# Patient Record
Sex: Female | Born: 2000 | Race: Black or African American | Hispanic: No | Marital: Single | Smoking: Never smoker
Health system: Southern US, Community
[De-identification: ages and names within clinical notes are randomized; demographics above are authoritative.]

## PROBLEM LIST (undated history)

## (undated) DIAGNOSIS — D649 Anemia, unspecified: Secondary | ICD-10-CM

## (undated) HISTORY — PX: HERNIA REPAIR: SHX51

---
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
## 2019-07-08 ENCOUNTER — Encounter (HOSPITAL_COMMUNITY): Payer: Self-pay

## 2019-07-08 ENCOUNTER — Other Ambulatory Visit: Payer: Self-pay

## 2019-07-08 ENCOUNTER — Emergency Department (HOSPITAL_COMMUNITY)
Admission: EM | Admit: 2019-07-08 | Discharge: 2019-07-09 | Disposition: A | Discharge status: Home / Self Care | Payer: BC Managed Care – PPO | Attending: Emergency Medicine | Admitting: Emergency Medicine

## 2019-07-08 DIAGNOSIS — R509 Fever, unspecified: Secondary | ICD-10-CM | POA: Insufficient documentation

## 2019-07-08 DIAGNOSIS — N390 Urinary tract infection, site not specified: Secondary | ICD-10-CM | POA: Diagnosis not present

## 2019-07-08 DIAGNOSIS — Z20822 Contact with and (suspected) exposure to covid-19: Secondary | ICD-10-CM | POA: Insufficient documentation

## 2019-07-08 DIAGNOSIS — R110 Nausea: Secondary | ICD-10-CM | POA: Insufficient documentation

## 2019-07-08 DIAGNOSIS — R109 Unspecified abdominal pain: Secondary | ICD-10-CM | POA: Diagnosis present

## 2019-07-08 MED ORDER — ACETAMINOPHEN 500 MG PO TABS
1000.0000 mg | ORAL_TABLET | Freq: Once | ORAL | Status: AC
  Administered 2019-07-08: 1000 mg via ORAL
  Filled 2019-07-08: qty 2

## 2019-07-08 MED ORDER — LACTATED RINGERS IV BOLUS
1000.0000 mL | Freq: Once | INTRAVENOUS | Status: AC
  Administered 2019-07-09: 1000 mL via INTRAVENOUS

## 2019-07-08 MED ORDER — ONDANSETRON HCL 4 MG/2ML IJ SOLN
4.0000 mg | Freq: Once | INTRAMUSCULAR | Status: DC
  Filled 2019-07-08: qty 2

## 2019-07-08 MED ORDER — ACETAMINOPHEN 325 MG PO TABS: 650.0000 mg | ORAL_TABLET | Freq: Once | ORAL | Status: DC | PRN

## 2019-07-08 MED ORDER — KETOROLAC TROMETHAMINE 30 MG/ML IJ SOLN
30.0000 mg | Freq: Once | INTRAMUSCULAR | Status: AC
  Administered 2019-07-09: 30 mg via INTRAVENOUS
  Filled 2019-07-08: qty 1

## 2019-07-08 NOTE — ED Provider Notes (Addendum)
TIME SEEN: 11:41 PM  CHIEF COMPLAINT: Bilateral flank pain, fever, chills, nausea, urinary frequency and urgency  HPI: Patient is a 19 year old female with history of anemia who presents to the emergency department with complaints of fevers, chills that started today.  Reports since 06/14/2019 she has had urinary symptoms.  She was seen at an urgent care and started on Macrobid which she took for 5 days.  She reports at that time she had urinary frequency, urgency and cloudy urine.  She feels like her symptoms improved but never resolved.  Now she is feeling worse and having bilateral flank pain, nausea.  No vomiting.  No diarrhea.  Currently on her menstrual cycle.  No history of kidney stones or pyelonephritis.  She denies any cough, sore throat, runny nose, Covid exposures.  She has not been vaccinated for COVID-19.  ROS: See HPI Constitutional:  fever  Eyes: no drainage  ENT: no runny nose   Cardiovascular:  no chest pain  Resp: no SOB  GI: no vomiting GU: no dysuria; + urinary frequency and urgency Integumentary: no rash  Allergy: no hives  Musculoskeletal: no leg swelling  Neurological: no slurred speech ROS otherwise negative  PAST MEDICAL HISTORY/PAST SURGICAL HISTORY:  Past Medical History:  Diagnosis Date  . Anemia     MEDICATIONS:  Prior to Admission medications   Medication Sig Start Date End Date Taking? Authorizing Provider  Ascorbic Acid (VITAMIN C PO) Take 1 tablet by mouth daily.    [provider]  CRANBERRY EXTRACT PO Take 1 tablet by mouth daily.    [provider]  ferrous sulfate 325 (65 FE) MG tablet Take 1 tablet (325 mg total) by mouth daily. 01/16/17   Everlene Farrier, PA-C  meclizine (ANTIVERT) 12.5 MG tablet Take 1 tablet (12.5 mg total) by mouth 3 (three) times daily as needed for dizziness. 09/04/17   Margarita Grizzle, MD  tretinoin (RETIN-A) 0.01 % gel Apply 1 application topically daily. 06/28/17   [provider]    ALLERGIES:   No Known Allergies  SOCIAL HISTORY:  Social History   Tobacco Use  . Smoking status: Never Smoker  . Smokeless tobacco: Never Used  Substance Use Topics  . Alcohol use: Not Currently    FAMILY HISTORY: No family history on file.  EXAM: BP (S) (!) 84/59 (BP Location: Right Arm)   Pulse 89   Temp (!) 102 F (38.9 C) (Oral)   Resp 19   Ht 5\' 5"  (1.651 m)   Wt 75 kg   SpO2 100%   BMI 27.51 kg/m  CONSTITUTIONAL: Alert and oriented and responds appropriately to questions. Well-appearing; well-nourished, in no distress, nontoxic-appearing HEAD: Normocephalic EYES: Conjunctivae clear, pupils appear equal, EOM appear intact ENT: normal nose; moist mucous membranes NECK: Supple, normal ROM CARD: RRR; S1 and S2 appreciated; no murmurs, no clicks, no rubs, no gallops RESP: Normal chest excursion without splinting or tachypnea; breath sounds clear and equal bilaterally; no wheezes, no rhonchi, no rales, no hypoxia or respiratory distress, speaking full sentences ABD/GI: Normal bowel sounds; non-distended; soft, non-tender, no rebound, no guarding, no peritoneal signs, no hepatosplenomegaly BACK:  The back appears normal EXT: Normal ROM in all joints; no deformity noted, no edema; no cyanosis SKIN: Normal color for age and race; warm; no rash on exposed skin NEURO: Moves all extremities equally PSYCH: The patient's mood and manner are appropriate.   MEDICAL DECISION MAKING: Patient here with symptoms of UTI versus pyelonephritis.  Will obtain CT scan to  rule out kidney stone.  She is hypertensive here but is young and small so I suspect that this is actually not far off from her normal blood pressure.  She is febrile.  Will give Tylenol, IV fluids.  Will treat symptoms with Toradol, Zofran.  Abdominal exam today is benign.  She is nontoxic-appearing.  Will obtain labs, cultures, Covid swab for possible admission.  ED PROGRESS: Patient's work-up shows leukocytosis With left shift.   Lactate normal.  She does appear to have a urinary tract infection.  CT shows no pyelonephritis, kidney stone.  CT findings suggestive of UTI.  Will discharge with cefdinir x 10 days to cover clinically for early pyelonephritis.  Given dose of Rocephin here.  Blood pressures have improved and patient continues to be well-appearing and states she is feeling better.  She is comfortable with this plan.   At this time, I do not feel there is any life-threatening condition present. I have reviewed, interpreted and discussed all results (EKG, imaging, lab, urine as appropriate) and exam findings with patient/family. I have reviewed nursing notes and appropriate previous records.  I feel the patient is safe to be discharged home without further emergent workup and can continue workup as an outpatient as needed. Discussed usual and customary return precautions. Patient/family verbalize understanding and are comfortable with this plan.  Outpatient follow-up has been provided as needed. All questions have been answered.   CRITICAL CARE Performed by: Rochele Raring   Total critical care time: 45 minutes  Critical care time was exclusive of separately billable procedures and treating other patients.  Critical care was necessary to treat or prevent imminent or life-threatening deterioration.  Critical care was time spent personally by me on the following activities: development of treatment plan with patient and/or surrogate as well as nursing, discussions with consultants, evaluation of patient's response to treatment, examination of patient, obtaining history from patient or surrogate, ordering and performing treatments and interventions, ordering and review of laboratory studies, ordering and review of radiographic studies, pulse oximetry and re-evaluation of patient's condition.   Tammie Cole was evaluated in Emergency Department on 07/08/2019 for the symptoms described in the history of present illness.  She was evaluated in the context of the global COVID-19 pandemic, which necessitated consideration that the patient might be at risk for infection with the SARS-CoV-2 virus that causes COVID-19. Institutional protocols and algorithms that pertain to the evaluation of patients at risk for COVID-19 are in a state of rapid change based on information released by regulatory bodies including the CDC and federal and state organizations. These policies and algorithms were followed during the patient's care in the ED.      Gaynor Ferreras, Layla Maw, DO 07/09/19 0222    Breton Berns, Layla Maw, DO 07/09/19 [phone]

## 2019-07-08 NOTE — ED Triage Notes (Signed)
Pt sts a UTI for several weeks. Took entire Macrobid and having worsening bilateral flank pain, fever, chills.

## 2019-07-09 ENCOUNTER — Emergency Department (HOSPITAL_COMMUNITY): Payer: BC Managed Care – PPO

## 2019-07-09 ENCOUNTER — Encounter (HOSPITAL_COMMUNITY): Payer: Self-pay

## 2019-07-09 LAB — I-STAT BETA HCG BLOOD, ED (MC, WL, AP ONLY): I-stat hCG, quantitative: <5 m[IU]/mL (ref <5)

## 2019-07-09 LAB — URINALYSIS, ROUTINE W REFLEX MICROSCOPIC
Bilirubin Urine: NEGATIVE
Glucose, UA: NEGATIVE mg/dL
Ketones, ur: 20 mg/dL — AB
Nitrite: POSITIVE — AB
Protein, ur: 30 mg/dL — AB
Specific Gravity, Urine: 1.03 (ref 1.005–1.030)
WBC, UA: >50 WBC/hpf — ABNORMAL HIGH (ref 0–5)
pH: 6 (ref 5.0–8.0)

## 2019-07-09 LAB — COMPREHENSIVE METABOLIC PANEL
ALT: 13 U/L (ref 0–44)
AST: 18 U/L (ref 15–41)
Albumin: 4.2 g/dL (ref 3.5–5.0)
Alkaline Phosphatase: 55 U/L (ref 38–126)
Anion gap: 10 (ref 5–15)
BUN: 7 mg/dL (ref 6–20)
CO2: 23 mmol/L (ref 22–32)
Calcium: 9.2 mg/dL (ref 8.9–10.3)
Chloride: 101 mmol/L (ref 98–111)
Creatinine, Ser: 0.82 mg/dL (ref 0.44–1.00)
GFR calc Af Amer: >60 mL/min (ref >60)
GFR calc non Af Amer: >60 mL/min (ref >60)
Glucose, Bld: 109 mg/dL — ABNORMAL HIGH (ref 70–99)
Potassium: 3.2 mmol/L — ABNORMAL LOW (ref 3.5–5.1)
Sodium: 134 mmol/L — ABNORMAL LOW (ref 135–145)
Total Bilirubin: 0.8 mg/dL (ref 0.3–1.2)
Total Protein: 8 g/dL (ref 6.5–8.1)

## 2019-07-09 LAB — CBC WITH DIFFERENTIAL/PLATELET
Abs Immature Granulocytes: 0.14 10*3/uL — ABNORMAL HIGH (ref 0.00–0.07)
Basophils Absolute: 0 10*3/uL (ref 0.0–0.1)
Basophils Relative: 0 %
Eosinophils Absolute: 0 10*3/uL (ref 0.0–0.5)
Eosinophils Relative: 0 %
HCT: 31.2 % — ABNORMAL LOW (ref 36.0–46.0)
Hemoglobin: 9.2 g/dL — ABNORMAL LOW (ref 12.0–15.0)
Immature Granulocytes: 1 %
Lymphocytes Relative: 4 %
Lymphs Abs: 0.7 10*3/uL (ref 0.7–4.0)
MCH: 23.7 pg — ABNORMAL LOW (ref 26.0–34.0)
MCHC: 29.5 g/dL — ABNORMAL LOW (ref 30.0–36.0)
MCV: 80.4 fL (ref 80.0–100.0)
Monocytes Absolute: 1.6 10*3/uL — ABNORMAL HIGH (ref 0.1–1.0)
Monocytes Relative: 10 %
Neutro Abs: 13.3 10*3/uL — ABNORMAL HIGH (ref 1.7–7.7)
Neutrophils Relative %: 85 %
Platelets: 241 10*3/uL (ref 150–400)
RBC: 3.88 MIL/uL (ref 3.87–5.11)
RDW: 17.7 % — ABNORMAL HIGH (ref 11.5–15.5)
WBC: 15.8 10*3/uL — ABNORMAL HIGH (ref 4.0–10.5)
nRBC: 0 % (ref 0.0–0.2)

## 2019-07-09 LAB — LACTIC ACID, PLASMA: Lactic Acid, Venous: 1 mmol/L (ref 0.5–1.9)

## 2019-07-09 LAB — SARS CORONAVIRUS 2 BY RT PCR (HOSPITAL ORDER, PERFORMED IN ~~LOC~~ HOSPITAL LAB): SARS Coronavirus 2: NEGATIVE

## 2019-07-09 MED ORDER — IOHEXOL 300 MG/ML  SOLN
100.0000 mL | Freq: Once | INTRAMUSCULAR | Status: AC | PRN
  Administered 2019-07-09: 100 mL via INTRAVENOUS

## 2019-07-09 MED ORDER — SODIUM CHLORIDE (PF) 0.9 % IJ SOLN
INTRAMUSCULAR | Status: AC
  Filled 2019-07-09: qty 50

## 2019-07-09 MED ORDER — SODIUM CHLORIDE 0.9 % IV SOLN
1.0000 g | Freq: Once | INTRAVENOUS | Status: AC
  Administered 2019-07-09: 1 g via INTRAVENOUS
  Filled 2019-07-09: qty 10

## 2019-07-09 MED ORDER — CEFDINIR 300 MG PO CAPS
300.0000 mg | ORAL_CAPSULE | Freq: Two times a day (BID) | ORAL | 0 refills | Status: DC
Start: 2019-07-09 — End: 2020-05-17

## 2019-07-09 MED ORDER — ONDANSETRON 4 MG PO TBDP
4.0000 mg | ORAL_TABLET | Freq: Four times a day (QID) | ORAL | 0 refills | Status: DC | PRN
Start: 2019-07-09 — End: 2019-07-09

## 2019-07-09 MED ORDER — ONDANSETRON 4 MG PO TBDP
4.0000 mg | ORAL_TABLET | Freq: Four times a day (QID) | ORAL | 0 refills | Status: AC | PRN
Start: 2019-07-09 — End: ?

## 2019-07-09 MED ORDER — CEFDINIR 300 MG PO CAPS
300.0000 mg | ORAL_CAPSULE | Freq: Two times a day (BID) | ORAL | 0 refills | Status: DC
Start: 2019-07-09 — End: 2019-07-09

## 2019-07-09 NOTE — Discharge Instructions (Signed)
You may alternate Tylenol 1000 mg every 6 hours as needed for pain, fever and Ibuprofen 800 mg every 8 hours as needed for pain, fever.  Please take Ibuprofen with food.  Do not take more than 4000 mg of Tylenol (acetaminophen) in a 24 hour period.    Steps to find a Primary Care Provider (PCP):  Call [phone] or [phone] to access "[facility] Find a Doctor Service."  2.  You may also go on the [facility] website at [email or web address]  3.  [facility] also frequently accepts new patients.  [facility]  [address] [place] [address] [phone]  4.  There are also multiple [facility], [facility] and [facility] practices throughout the [place] that are frequently accepting new patients. You may find a clinic that is close to your home and contact them.  [email or web address] [phone]  [facility] [email or web address]  [email or web address] [phone]  [email or web address] [phone]  5.  Local Health Departments also can provide primary care services.  [facility]  [address] [place] [address] [phone]  [facility] [address] [place] [address] [phone]  [facility] [address]  [place] [address] [phone]   

## 2019-07-11 LAB — URINE CULTURE: Culture: >=100000 — AB

## 2019-07-12 ENCOUNTER — Telehealth: Payer: Self-pay

## 2019-07-12 NOTE — Telephone Encounter (Signed)
Post ED Visit - Positive Culture Follow-up  Culture report reviewed by antimicrobial stewardship pharmacist: [facility] []  , Pharm.D. []  Enzo Bi, Pharm.D., BCPS AQ-ID []  , Pharm.D., BCPS []  Celedonio Miyamoto, Pharm.D., BCPS []  [place], Garvin Fila.D., BCPS, AAHIVP []  , Pharm.D., BCPS, AAHIVP []  Georgina Pillion, PharmD, BCPS []  , PharmD, BCPS []  [place], PharmD, BCPS []  [place], PharmD []  , PharmD, BCPS []  Estella Husk, PharmD  Pharmacy Team []  Lysle Pearl, PharmD []  , PharmD []  Phillips Climes, PharmD []  , Rph []  Agapito Games) , PharmD []  Verlan Friends, PharmD []  , PharmD []  Mervyn Gay, PharmD []  , PharmD []  Vinnie Level, PharmD []  Wonda Olds, PharmD []  , PharmD [x]  Len Childs, PharmD   Positive urine culture Treated with Cefdinir, organism sensitive to the same and no further patient follow-up is required at this time.  07/12/2019, 11:49 AM

## 2019-07-14 LAB — CULTURE, BLOOD (ROUTINE X 2)
Culture: NO GROWTH
Culture: NO GROWTH

## 2019-07-18 ENCOUNTER — Other Ambulatory Visit: Payer: Self-pay

## 2019-07-18 ENCOUNTER — Emergency Department (HOSPITAL_COMMUNITY)
Admission: EM | Admit: 2019-07-18 | Discharge: 2019-07-19 | Discharge status: Against Medical Advice | Payer: BC Managed Care – PPO

## 2020-01-15 ENCOUNTER — Emergency Department (HOSPITAL_COMMUNITY)
Admission: EM | Admit: 2020-01-15 | Discharge: 2020-01-15 | Disposition: A | Discharge status: Home / Self Care | Payer: BC Managed Care – PPO | Attending: Emergency Medicine | Admitting: Emergency Medicine

## 2020-01-15 ENCOUNTER — Encounter (HOSPITAL_COMMUNITY): Payer: Self-pay

## 2020-01-15 ENCOUNTER — Other Ambulatory Visit: Payer: Self-pay

## 2020-01-15 DIAGNOSIS — Z5321 Procedure and treatment not carried out due to patient leaving prior to being seen by health care provider: Secondary | ICD-10-CM | POA: Insufficient documentation

## 2020-01-15 DIAGNOSIS — K469 Unspecified abdominal hernia without obstruction or gangrene: Secondary | ICD-10-CM | POA: Insufficient documentation

## 2020-01-15 NOTE — ED Notes (Signed)
This nurse spoke with patients mother on speaker phone per patient request. Mother states patient is not comfortable staying to be seen and requesting to leave. This nurse spoke with patient and mother about the current wait and the accomodation to lay flat while waiting for a room provided to the patient. Patient and mother verbalized understanding and asking about process to leave. This nurse explained that we would like for her to stay to be seen by a provider but patient declined and ambulated out of the department with no difficulty.

## 2020-01-15 NOTE — ED Triage Notes (Signed)
Patient arrived stating she had an abdominal wall hernia repaired in 2015, states it protruded again tonight.

## 2020-02-21 ENCOUNTER — Emergency Department (HOSPITAL_COMMUNITY)
Admission: EM | Admit: 2020-02-21 | Discharge: 2020-02-22 | Disposition: A | Discharge status: Home / Self Care | Payer: BC Managed Care – PPO | Attending: Emergency Medicine | Admitting: Emergency Medicine

## 2020-02-21 ENCOUNTER — Other Ambulatory Visit: Payer: Self-pay

## 2020-02-21 DIAGNOSIS — D649 Anemia, unspecified: Secondary | ICD-10-CM | POA: Insufficient documentation

## 2020-02-21 DIAGNOSIS — R109 Unspecified abdominal pain: Secondary | ICD-10-CM

## 2020-02-21 DIAGNOSIS — R1033 Periumbilical pain: Secondary | ICD-10-CM | POA: Diagnosis present

## 2020-02-21 LAB — CBC WITH DIFFERENTIAL/PLATELET
Abs Immature Granulocytes: 0.03 10*3/uL (ref 0.00–0.07)
Basophils Absolute: 0.1 10*3/uL (ref 0.0–0.1)
Basophils Relative: 1 %
Eosinophils Absolute: 0.1 10*3/uL (ref 0.0–0.5)
Eosinophils Relative: 1 %
HCT: 27.3 % — ABNORMAL LOW (ref 36.0–46.0)
Hemoglobin: 7.4 g/dL — ABNORMAL LOW (ref 12.0–15.0)
Immature Granulocytes: 0 %
Lymphocytes Relative: 22 %
Lymphs Abs: 2 10*3/uL (ref 0.7–4.0)
MCH: 19.2 pg — ABNORMAL LOW (ref 26.0–34.0)
MCHC: 27.1 g/dL — ABNORMAL LOW (ref 30.0–36.0)
MCV: 70.7 fL — ABNORMAL LOW (ref 80.0–100.0)
Monocytes Absolute: 0.9 10*3/uL (ref 0.1–1.0)
Monocytes Relative: 9 %
Neutro Abs: 6.2 10*3/uL (ref 1.7–7.7)
Neutrophils Relative %: 67 %
Platelets: 352 10*3/uL (ref 150–400)
RBC: 3.86 MIL/uL — ABNORMAL LOW (ref 3.87–5.11)
RDW: 20.1 % — ABNORMAL HIGH (ref 11.5–15.5)
WBC: 9.3 10*3/uL (ref 4.0–10.5)
nRBC: 0 % (ref 0.0–0.2)

## 2020-02-21 NOTE — ED Triage Notes (Signed)
Pt came in with abdominal pain from umbilical hernia. Pt had surgery in 2015, but states she is seeing another surgeon currently for another possible surgery. Pt states she has had uncontrolled pain radiating to her groin that started two days ago. Pt took extended release tylenol 20 min ago.

## 2020-02-21 NOTE — ED Provider Notes (Signed)
[facility]-EMERGENCY DEPT Provider Note   CSN: [id number] Arrival date & time: 02/21/20  2219     History Chief Complaint  Patient presents with  . Abdominal Pain    Tammie Cole is a 20 y.o. female.   Abdominal Pain Pain location:  Periumbilical Pain quality: aching   Pain radiates to:  Does not radiate Pain severity:  Moderate Onset quality:  Gradual Timing:  Intermittent Chronicity:  Recurrent Context comment:  History of abdominal wall hernia that she feels is protruding again Relieved by: Not bearing down. Exacerbated by: movement and bearing down. Ineffective treatments:  None tried Associated symptoms: no chest pain, no chills, no cough, no diarrhea, no dysuria, no fever, no nausea, no shortness of breath and no vomiting        Past Medical History:  Diagnosis Date  . Anemia     There are no problems to display for this patient.   Past Surgical History:  Procedure Laterality Date  . HERNIA REPAIR       OB History   No obstetric history on file.     No family history on file.  Social History   Tobacco Use  . Smoking status: Never Smoker  . Smokeless tobacco: Never Used  Substance Use Topics  . Alcohol use: Not Currently  . Drug use: Not Currently    Home Medications Prior to Admission medications   Medication Sig Start Date End Date Taking? Authorizing Provider  Acetaminophen (TYLENOL 8 HOUR PO) Take 1 tablet by mouth 2 (two) times daily as needed.   Yes [provider]  cefdinir (OMNICEF) 300 MG capsule Take 1 capsule (300 mg total) by mouth 2 (two) times daily. Patient not taking: Reported on 02/21/2020 07/09/19   Ward, Layla Maw, DO  ondansetron (ZOFRAN ODT) 4 MG disintegrating tablet Take 1 tablet (4 mg total) by mouth every 6 (six) hours as needed for nausea or vomiting. Patient not taking: Reported on 02/21/2020 07/09/19   Ward, Layla Maw, DO    Allergies    Patient has no known allergies.  Review of  Systems   Review of Systems  Constitutional: Negative for chills and fever.  HENT: Negative for congestion and rhinorrhea.   Respiratory: Negative for cough and shortness of breath.   Cardiovascular: Negative for chest pain and palpitations.  Gastrointestinal: Positive for abdominal pain. Negative for diarrhea, nausea and vomiting.  Genitourinary: Negative for difficulty urinating and dysuria.  Musculoskeletal: Negative for arthralgias and back pain.  Skin: Negative for rash and wound.  Neurological: Negative for light-headedness and headaches.    Physical Exam Updated Vital Signs BP 110/73 (BP Location: Right Arm)   Pulse 88   Temp 97.9 F (36.6 C) (Oral)   Resp 15   Ht 5\' 5"  (1.651 m)   Wt 67.1 kg   SpO2 100%   BMI 24.63 kg/m   Physical Exam Vitals and nursing note reviewed. Exam conducted with a chaperone present.  Constitutional:      General: She is not in acute distress.    Appearance: Normal appearance.  HENT:     Head: Normocephalic and atraumatic.     Nose: No rhinorrhea.  Eyes:     General:        Right eye: No discharge.        Left eye: No discharge.     Conjunctiva/sclera: Conjunctivae normal.  Cardiovascular:     Rate and Rhythm: Normal rate and regular rhythm.  Pulmonary:  Effort: Pulmonary effort is normal. No respiratory distress.     Breath sounds: No stridor.  Abdominal:     General: Abdomen is flat. There is no distension.     Palpations: Abdomen is soft.     Tenderness: There is abdominal tenderness in the periumbilical area. There is no guarding or rebound. Negative signs include Murphy's sign and Rovsing's sign.     Hernia: No hernia is present.  Musculoskeletal:        General: No tenderness or signs of injury.  Skin:    General: Skin is warm and dry.  Neurological:     General: No focal deficit present.     Mental Status: She is alert. Mental status is at baseline.     Motor: No weakness.  Psychiatric:        Mood and Affect: Mood  normal.        Behavior: Behavior normal.     ED Results / Procedures / Treatments   Labs (all labs ordered are listed, but only abnormal results are displayed) Labs Reviewed  CBC WITH DIFFERENTIAL/PLATELET - Abnormal; Notable for the following components:      Result Value   RBC 3.86 (*)    Hemoglobin 7.4 (*)    HCT 27.3 (*)    MCV 70.7 (*)    MCH 19.2 (*)    MCHC 27.1 (*)    RDW 20.1 (*)    All other components within normal limits  COMPREHENSIVE METABOLIC PANEL - Abnormal; Notable for the following components:   Potassium 3.4 (*)    Total Protein 8.3 (*)    All other components within normal limits  URINALYSIS, ROUTINE W REFLEX MICROSCOPIC  I-STAT BETA HCG BLOOD, ED ([facility], [facility], [facility] ONLY)    EKG None  Radiology No results found.  Procedures Procedures   Medications Ordered in ED Medications  ferumoxytol (FERAHEME) 510 mg in sodium chloride 0.9 % 100 mL IVPB ( Intravenous Stopped 02/22/20 0203)    ED Course  I have reviewed the triage vital signs and the nursing notes.  Pertinent labs & imaging results that were available during my care of the patient were reviewed by me and considered in my medical decision making (see chart for details).    MDM Rules/Calculators/A&P                          Patient comes in with months to years of worsening abdominal pain associated with an abdominal wall hernia.  She had a repair but feels that it is continue to protrude.  She is tolerating p.o. she is having bowel movements she is using Colace for stool softeners as things like bearing down make it worse.  On exam she has periumbilical tenderness but no peritoneal signs.  She is well-hydrated normal vital signs laboratory studies have been drawn by triage.  I feel no urgent need for CT imaging.  I feel that she just needs outpatient follow-up with general surgery.  She states the general surgeon is waiting to schedule appointment until she has release of records sent to his  office.  Review of this patient's labs show that she has chronic worsening anemia.  She states she is aware of this but no longer takes her iron because it constipates her which exacerbates her hernia situation.  We will give her IV iron and have her follow-up.  Other laboratory studies are unremarkable.  As for further need of imaging I feel  that she does not emergently need imaging but she does need follow-up with general surgery.  Patient tolerated IV iron infusion well.  Vital signs remained stable patient remains comfortable abdominal wall binder is placed to help patient's hernia.  She feels comfortable discharge home.  She has not explained this to her mother.  Her mother agrees with plan.  Outpatient surgery follow-up as needed.  Final Clinical Impression(s) / ED Diagnoses Final diagnoses:  Undifferentiated abdominal pain  Anemia, unspecified type    Rx / DC Orders ED Discharge Orders    None       Sabino Donovan, MD 02/22/20 [phone]

## 2020-02-22 LAB — I-STAT BETA HCG BLOOD, ED (MC, WL, AP ONLY): I-stat hCG, quantitative: <5 m[IU]/mL (ref <5)

## 2020-02-22 LAB — COMPREHENSIVE METABOLIC PANEL
ALT: 15 U/L (ref 0–44)
AST: 21 U/L (ref 15–41)
Albumin: 4.4 g/dL (ref 3.5–5.0)
Alkaline Phosphatase: 49 U/L (ref 38–126)
Anion gap: 11 (ref 5–15)
BUN: 16 mg/dL (ref 6–20)
CO2: 24 mmol/L (ref 22–32)
Calcium: 9.6 mg/dL (ref 8.9–10.3)
Chloride: 101 mmol/L (ref 98–111)
Creatinine, Ser: 0.72 mg/dL (ref 0.44–1.00)
GFR, Estimated: >60 mL/min (ref >60)
Glucose, Bld: 86 mg/dL (ref 70–99)
Potassium: 3.4 mmol/L — ABNORMAL LOW (ref 3.5–5.1)
Sodium: 136 mmol/L (ref 135–145)
Total Bilirubin: 0.7 mg/dL (ref 0.3–1.2)
Total Protein: 8.3 g/dL — ABNORMAL HIGH (ref 6.5–8.1)

## 2020-02-22 MED ORDER — SODIUM CHLORIDE 0.9 % IV SOLN
510.0000 mg | Freq: Once | INTRAVENOUS | Status: AC
  Administered 2020-02-22: 510 mg via INTRAVENOUS
  Filled 2020-02-22: qty 510

## 2020-02-22 NOTE — Discharge Instructions (Addendum)
You can take 600 mg of ibuprofen every 6 hours, you can take 1000 mg of Tylenol every 6 hours, you can alternate these every 3 or you can take them together.  Minder to help with the hernia.  Return to [place] if you have any concerns.  Specifically look out for the hernia coming out and not being able to go back yet.  Also if you start having bowel movements start getting significant abdominal bloating

## 2020-02-29 ENCOUNTER — Other Ambulatory Visit: Payer: Self-pay

## 2020-02-29 ENCOUNTER — Emergency Department
Admission: EM | Admit: 2020-02-29 | Discharge: 2020-03-01 | Disposition: A | Discharge status: Home / Self Care | Payer: BC Managed Care – PPO | Attending: Emergency Medicine | Admitting: Emergency Medicine

## 2020-02-29 DIAGNOSIS — N390 Urinary tract infection, site not specified: Secondary | ICD-10-CM | POA: Diagnosis not present

## 2020-02-29 DIAGNOSIS — R1033 Periumbilical pain: Secondary | ICD-10-CM

## 2020-02-29 LAB — COMPREHENSIVE METABOLIC PANEL
ALT: 19 U/L (ref 0–44)
AST: 29 U/L (ref 15–41)
Albumin: 4.1 g/dL (ref 3.5–5.0)
Alkaline Phosphatase: 48 U/L (ref 38–126)
Anion gap: 11 (ref 5–15)
BUN: 15 mg/dL (ref 6–20)
CO2: 18 mmol/L — ABNORMAL LOW (ref 22–32)
Calcium: 9.2 mg/dL (ref 8.9–10.3)
Chloride: 107 mmol/L (ref 98–111)
Creatinine, Ser: 0.7 mg/dL (ref 0.44–1.00)
GFR, Estimated: >60 mL/min (ref >60)
Glucose, Bld: 93 mg/dL (ref 70–99)
Potassium: 3.9 mmol/L (ref 3.5–5.1)
Sodium: 136 mmol/L (ref 135–145)
Total Bilirubin: 0.8 mg/dL (ref 0.3–1.2)
Total Protein: 7.9 g/dL (ref 6.5–8.1)

## 2020-02-29 LAB — LIPASE, BLOOD: Lipase: 27 U/L (ref 11–51)

## 2020-02-29 LAB — POC URINE PREG, ED: Preg Test, Ur: NEGATIVE

## 2020-02-29 MED ORDER — KETOROLAC TROMETHAMINE 30 MG/ML IJ SOLN
30.0000 mg | Freq: Once | INTRAMUSCULAR | Status: AC
  Administered 2020-03-01: 30 mg via INTRAVENOUS
  Filled 2020-02-29: qty 1

## 2020-02-29 NOTE — ED Triage Notes (Signed)
Pt states she was dx with abd hernia and was seen 1 week ago at [facility], pt was told symptoms to be concerned over for herniation and pt presents tonight with worsening abd pain, hot to the touch and has been unable to have a bowel movement for 3 days.

## 2020-02-29 NOTE — ED Notes (Signed)
Per MD ward no imaging at this time

## 2020-03-01 LAB — URINALYSIS, COMPLETE (UACMP) WITH MICROSCOPIC
Bilirubin Urine: NEGATIVE
Glucose, UA: NEGATIVE mg/dL
Hgb urine dipstick: NEGATIVE
Ketones, ur: NEGATIVE mg/dL
Nitrite: POSITIVE — AB
Protein, ur: NEGATIVE mg/dL
Specific Gravity, Urine: 1.021 (ref 1.005–1.030)
pH: 6 (ref 5.0–8.0)

## 2020-03-01 LAB — CBC
HCT: 31.3 % — ABNORMAL LOW (ref 36.0–46.0)
Hemoglobin: 8.6 g/dL — ABNORMAL LOW (ref 12.0–15.0)
MCH: 21.4 pg — ABNORMAL LOW (ref 26.0–34.0)
MCHC: 27.5 g/dL — ABNORMAL LOW (ref 30.0–36.0)
MCV: 77.9 fL — ABNORMAL LOW (ref 80.0–100.0)
Platelets: 270 10*3/uL (ref 150–400)
RBC: 4.02 MIL/uL (ref 3.87–5.11)
RDW: 28.7 % — ABNORMAL HIGH (ref 11.5–15.5)
WBC: 10.6 10*3/uL — ABNORMAL HIGH (ref 4.0–10.5)
nRBC: 0 % (ref 0.0–0.2)

## 2020-03-01 MED ORDER — CEPHALEXIN 500 MG PO CAPS: 500.0000 mg | ORAL_CAPSULE | Freq: Two times a day (BID) | ORAL | 0 refills | Status: DC

## 2020-03-01 MED ORDER — CEPHALEXIN 500 MG PO CAPS
500.0000 mg | ORAL_CAPSULE | Freq: Once | ORAL | Status: AC
  Administered 2020-03-01: 500 mg via ORAL
  Filled 2020-03-01: qty 1

## 2020-03-01 NOTE — ED Provider Notes (Signed)
[facility] Emergency Department Provider Note  ____________________________________________   Event Date/Time   First MD Initiated Contact with Patient 02/29/20 2331     (approximate)  I have reviewed the triage vital signs and the nursing notes.   HISTORY  Chief Complaint Abdominal Pain    HPI Tammie Cole is a 20 y.o. female with history of previous umbilical hernia status post surgical repair, anemia who presents to the emergency department with periumbilical pain.  States she is concerned that she could have an incarcerated hernia.  She states that she felt a burning pain over this area.  No overlying skin changes.  No fever, nausea or vomiting.  States he has not had a bowel movement in 3 days which is abnormal for her.  No dysuria, hematuria, vaginal bleeding or discharge.  She has an appointment with general surgery scheduled on March 17.        Past Medical History:  Diagnosis Date  . Anemia     There are no problems to display for this patient.   Past Surgical History:  Procedure Laterality Date  . HERNIA REPAIR      Prior to Admission medications   Medication Sig Start Date End Date Taking? Authorizing Provider  cephALEXin (KEFLEX) 500 MG capsule Take 1 capsule (500 mg total) by mouth 2 (two) times daily. 03/01/20  Yes Anapaola Kinsel, Layla Maw, DO  Acetaminophen (TYLENOL 8 HOUR PO) Take 1 tablet by mouth 2 (two) times daily as needed.    [provider]  cefdinir (OMNICEF) 300 MG capsule Take 1 capsule (300 mg total) by mouth 2 (two) times daily. Patient not taking: Reported on 02/21/2020 07/09/19   Johnthan Axtman, Layla Maw, DO  ondansetron (ZOFRAN ODT) 4 MG disintegrating tablet Take 1 tablet (4 mg total) by mouth every 6 (six) hours as needed for nausea or vomiting. Patient not taking: Reported on 02/21/2020 07/09/19   Keneth Borg, Layla Maw, DO    Allergies Patient has no known allergies.  No family history on file.  Social History Social  History   Tobacco Use  . Smoking status: Never Smoker  . Smokeless tobacco: Never Used  Substance Use Topics  . Alcohol use: Not Currently  . Drug use: Not Currently    Review of Systems Constitutional: No fever. Eyes: No visual changes. ENT: No sore throat. Cardiovascular: Denies chest pain. Respiratory: Denies shortness of breath. Gastrointestinal: No nausea, vomiting, diarrhea. Genitourinary: Negative for dysuria. Musculoskeletal: Negative for back pain. Skin: Negative for rash. Neurological: Negative for focal weakness or numbness.  ____________________________________________   PHYSICAL EXAM:  VITAL SIGNS: ED Triage Vitals  Enc Vitals Group     BP 02/29/20 2245 102/69     Pulse Rate 02/29/20 2245 81     Resp 02/29/20 2245 16     Temp 02/29/20 2245 98.4 F (36.9 C)     Temp src --      SpO2 02/29/20 2245 97 %     Weight 02/29/20 2243 150 lb (68 kg)     Height 02/29/20 2243 5\' 5"  (1.651 m)     Head Circumference --      Peak Flow --      Pain Score 02/29/20 2243 7     Pain Loc --      Pain Edu? --      Excl. in GC? --    CONSTITUTIONAL: Alert and oriented and responds appropriately to questions. Well-appearing; well-nourished HEAD: Normocephalic EYES: Conjunctivae clear, pupils appear equal, EOM  appear intact ENT: normal nose; moist mucous membranes NECK: Supple, normal ROM CARD: RRR; S1 and S2 appreciated; no murmurs, no clicks, no rubs, no gallops RESP: Normal chest excursion without splinting or tachypnea; breath sounds clear and equal bilaterally; no wheezes, no rhonchi, no rales, no hypoxia or respiratory distress, speaking full sentences ABD/GI: Normal bowel sounds; non-distended; soft, tender around the umbilicus without overlying skin changes.  I do not appreciate a palpable hernia.  When distracted her abdominal exam is benign. BACK: The back appears normal EXT: Normal ROM in all joints; no deformity noted, no edema; no cyanosis SKIN: Normal color  for age and race; warm; no rash on exposed skin NEURO: Moves all extremities equally PSYCH: The patient's mood and manner are appropriate.  ____________________________________________   LABS (all labs ordered are listed, but only abnormal results are displayed)  Labs Reviewed  COMPREHENSIVE METABOLIC PANEL - Abnormal; Notable for the following components:      Result Value   CO2 18 (*)    All other components within normal limits  URINALYSIS, COMPLETE (UACMP) WITH MICROSCOPIC - Abnormal; Notable for the following components:   Color, Urine YELLOW (*)    APPearance HAZY (*)    Nitrite POSITIVE (*)    Leukocytes,Ua MODERATE (*)    All other components within normal limits  CBC - Abnormal; Notable for the following components:   WBC 10.6 (*)    Hemoglobin 8.6 (*)    HCT 31.3 (*)    MCV 77.9 (*)    MCH 21.4 (*)    MCHC 27.5 (*)    RDW 28.7 (*)    All other components within normal limits  URINE CULTURE  LIPASE, BLOOD  POC URINE PREG, ED   ____________________________________________  EKG  none ____________________________________________  RADIOLOGY I, Bahja Bence, personally viewed and evaluated these images (plain radiographs) as part of my medical decision making, as well as reviewing the written report by the radiologist.  ED MD interpretation:  none  Official radiology report(s): No results found.  ____________________________________________   PROCEDURES  Procedure(s) performed (including Critical Care):  Procedures    ____________________________________________   INITIAL IMPRESSION / ASSESSMENT AND PLAN / ED COURSE  As part of my medical decision making, I reviewed the following data within the electronic MEDICAL RECORD NUMBER Nursing notes reviewed and incorporated, Labs reviewed and Notes from prior ED visits         Patient here with periumbilical abdominal pain.  She was concerned this could be an incarcerated hernia.  I do not appreciate an  incarcerated hernia on exam and she has no overlying skin changes.  No vomiting.  Her bowel sounds are normal.  Abdomen is soft.  When distracted, abdominal exam completely benign.  I do not think this is a bowel obstruction, incarcerated or strangulated hernia, colitis or diverticulitis, appendicitis.  Labs, urine pending from triage.  Will give Toradol and reassess.  ED PROGRESS  Patient's labs show hemoglobin 8.6 which appears chronic for her.  She states she has not taking her iron tablets at this time as they cause constipation.  Pregnancy test negative.  LFTs, lipase, creatinine normal.  She does appear to have a possible urinary tract infection with moderate leukocytes and positive for nitrites.  She denies urinary symptoms but this could be what is causing her burning lower abdominal pain.  No vaginal bleeding or discharge and she is not currently on her menstrual cycle.  Abdominal exam continues to be benign.  Reports feeling better  after Toradol.  Will discharge with prescription of Keflex.  Urine culture pending.  Patient comfortable with this plan.   At this time, I do not feel there is any life-threatening condition present. I have reviewed, interpreted and discussed all results (EKG, imaging, lab, urine as appropriate) and exam findings with patient/family. I have reviewed nursing notes and appropriate previous records.  I feel the patient is safe to be discharged home without further emergent workup and can continue workup as an outpatient as needed. Discussed usual and customary return precautions. Patient/family verbalize understanding and are comfortable with this plan.  Outpatient follow-up has been provided as needed. All questions have been answered.  ____________________________________________   FINAL CLINICAL IMPRESSION(S) / ED DIAGNOSES  Final diagnoses:  Periumbilical abdominal pain  Acute UTI     ED Discharge Orders         Ordered    cephALEXin (KEFLEX) 500 MG  capsule  2 times daily        03/01/20 0050          *Please note:  Linsie Lupo was evaluated in Emergency Department on 03/01/2020 for the symptoms described in the history of present illness. She was evaluated in the context of the global COVID-19 pandemic, which necessitated consideration that the patient might be at risk for infection with the SARS-CoV-2 virus that causes COVID-19. Institutional protocols and algorithms that pertain to the evaluation of patients at risk for COVID-19 are in a state of rapid change based on information released by regulatory bodies including the CDC and federal and state organizations. These policies and algorithms were followed during the patient's care in the ED.  Some ED evaluations and interventions may be delayed as a result of limited staffing during and the pandemic.*   Note:  This document was prepared using Dragon voice recognition software and may include unintentional dictation errors.   Jasmon Graffam, Layla Maw, DO 03/01/20 [phone]

## 2020-03-01 NOTE — Discharge Instructions (Addendum)
You may alternate Tylenol 1000 mg every 6 hours as needed for pain, fever and Ibuprofen 800 mg every 8 hours as needed for pain, fever.  Please take Ibuprofen with food.  Do not take more than 4000 mg of Tylenol (acetaminophen) in a 24 hour period.   Please follow-up with your surgeon as scheduled on the 17th.  Your labs are reassuring today but your urine showed signs of infection.  This could be the cause of your burning abdominal pain.  Please take your antibiotics until complete.

## 2020-03-03 LAB — URINE CULTURE: Culture: >=100000 — AB

## 2020-04-22 ENCOUNTER — Ambulatory Visit (HOSPITAL_COMMUNITY)
Admission: EM | Admit: 2020-04-22 | Discharge: 2020-04-22 | Disposition: A | Discharge status: Other Acute Inpatient Hospital | Payer: BC Managed Care – PPO | Attending: Urgent Care | Admitting: Urgent Care

## 2020-04-22 ENCOUNTER — Encounter (HOSPITAL_COMMUNITY): Payer: Self-pay

## 2020-04-22 ENCOUNTER — Other Ambulatory Visit: Payer: Self-pay

## 2020-04-22 ENCOUNTER — Emergency Department (HOSPITAL_COMMUNITY)
Admission: EM | Admit: 2020-04-22 | Discharge: 2020-04-22 | Disposition: A | Discharge status: Home / Self Care | Payer: BC Managed Care – PPO | Attending: Emergency Medicine | Admitting: Emergency Medicine

## 2020-04-22 DIAGNOSIS — J101 Influenza due to other identified influenza virus with other respiratory manifestations: Secondary | ICD-10-CM

## 2020-04-22 DIAGNOSIS — R000 Tachycardia, unspecified: Secondary | ICD-10-CM

## 2020-04-22 DIAGNOSIS — R059 Cough, unspecified: Secondary | ICD-10-CM | POA: Diagnosis present

## 2020-04-22 DIAGNOSIS — Z20822 Contact with and (suspected) exposure to covid-19: Secondary | ICD-10-CM | POA: Diagnosis not present

## 2020-04-22 DIAGNOSIS — J069 Acute upper respiratory infection, unspecified: Secondary | ICD-10-CM

## 2020-04-22 DIAGNOSIS — D649 Anemia, unspecified: Secondary | ICD-10-CM

## 2020-04-22 DIAGNOSIS — R9431 Abnormal electrocardiogram [ECG] [EKG]: Secondary | ICD-10-CM

## 2020-04-22 LAB — CBC WITH DIFFERENTIAL/PLATELET
Abs Immature Granulocytes: 0.03 10*3/uL (ref 0.00–0.07)
Basophils Absolute: 0 10*3/uL (ref 0.0–0.1)
Basophils Relative: 0 %
Eosinophils Absolute: 0 10*3/uL (ref 0.0–0.5)
Eosinophils Relative: 0 %
HCT: 35.9 % — ABNORMAL LOW (ref 36.0–46.0)
Hemoglobin: 10.4 g/dL — ABNORMAL LOW (ref 12.0–15.0)
Immature Granulocytes: 0 %
Lymphocytes Relative: 11 %
Lymphs Abs: 1.1 10*3/uL (ref 0.7–4.0)
MCH: 24.7 pg — ABNORMAL LOW (ref 26.0–34.0)
MCHC: 29 g/dL — ABNORMAL LOW (ref 30.0–36.0)
MCV: 85.3 fL (ref 80.0–100.0)
Monocytes Absolute: 0.9 10*3/uL (ref 0.1–1.0)
Monocytes Relative: 9 %
Neutro Abs: 7.7 10*3/uL (ref 1.7–7.7)
Neutrophils Relative %: 80 %
Platelets: 202 10*3/uL (ref 150–400)
RBC: 4.21 MIL/uL (ref 3.87–5.11)
RDW: 20.4 % — ABNORMAL HIGH (ref 11.5–15.5)
WBC: 9.8 10*3/uL (ref 4.0–10.5)
nRBC: 0 % (ref 0.0–0.2)

## 2020-04-22 LAB — URINALYSIS, ROUTINE W REFLEX MICROSCOPIC
Bilirubin Urine: NEGATIVE
Glucose, UA: NEGATIVE mg/dL
Ketones, ur: 20 mg/dL — AB
Nitrite: NEGATIVE
Protein, ur: 30 mg/dL — AB
Specific Gravity, Urine: 1.023 (ref 1.005–1.030)
pH: 5 (ref 5.0–8.0)

## 2020-04-22 LAB — BASIC METABOLIC PANEL
Anion gap: 11 (ref 5–15)
BUN: 9 mg/dL (ref 6–20)
CO2: 21 mmol/L — ABNORMAL LOW (ref 22–32)
Calcium: 9.1 mg/dL (ref 8.9–10.3)
Chloride: 103 mmol/L (ref 98–111)
Creatinine, Ser: 0.69 mg/dL (ref 0.44–1.00)
GFR, Estimated: >60 mL/min (ref >60)
Glucose, Bld: 89 mg/dL (ref 70–99)
Potassium: 3.5 mmol/L (ref 3.5–5.1)
Sodium: 135 mmol/L (ref 135–145)

## 2020-04-22 LAB — RESP PANEL BY RT-PCR (FLU A&B, COVID) ARPGX2
Influenza A by PCR: POSITIVE — AB
Influenza B by PCR: NEGATIVE
SARS Coronavirus 2 by RT PCR: NEGATIVE

## 2020-04-22 LAB — PREGNANCY, URINE: Preg Test, Ur: NEGATIVE

## 2020-04-22 MED ORDER — SODIUM CHLORIDE 0.9 % IV BOLUS
1000.0000 mL | Freq: Once | INTRAVENOUS | Status: AC
  Administered 2020-04-22: 1000 mL via INTRAVENOUS

## 2020-04-22 MED ORDER — ONDANSETRON HCL 4 MG PO TABS: 4.0000 mg | ORAL_TABLET | Freq: Three times a day (TID) | ORAL | 0 refills | Status: AC | PRN

## 2020-04-22 NOTE — ED Notes (Signed)
Patient is being discharged from the Urgent Care and sent to the Emergency Department via EMT . Per [facility], patient is in need of higher level of care due to elevated HR. Patient is aware and verbalizes understanding of plan of care.  Vitals:   04/22/20 1934 04/22/20 1951  BP: 98/63   Pulse: 80 (!) 160  Resp: 20   Temp: 98.7 F (37.1 C)   SpO2: 100%

## 2020-04-22 NOTE — Discharge Instructions (Signed)
You were seen in the emergency department for flulike symptoms and dehydration.  Your lab work showed you to be anemic but better than your baseline with a hemoglobin of 10.5.  You tested positive for influenza A.  Your COVID test was negative.  You will need to isolate till your symptoms are improved.

## 2020-04-22 NOTE — ED Notes (Signed)
Medications follow up appts reviewed w/ pt. Denies questions or concerns @ this time. Education on s/s of worsening and when to return. Evaluated for tachycardia., Found to have the flu. Heart rate was 98 since arrival and 85 after 1L fluids. States relief of sypmtoms @ this time. \Left w/ even and steady gait. NAD noted. PIV removed and VSS. Pt accompanied by friend.

## 2020-04-22 NOTE — ED Notes (Signed)
Reported HR to Mani-PA.

## 2020-04-22 NOTE — ED Triage Notes (Signed)
Pt c/o fever, sore throat, chills, coughing, watery eyes X 4 days.  Pt states from coughing a lot she states she has been dizzy.

## 2020-04-22 NOTE — ED Provider Notes (Signed)
[facility] EMERGENCY DEPARTMENT Provider Note   CSN: [id number] Arrival date & time: 04/22/20  2033     History Chief Complaint  Patient presents with  . Tachycardia    Tammie Cole is a 20 y.o. female.  She is complaining of flulike symptoms that have been going on for 4 days.  Fevers chills malaise nasal congestion sore throat.  Sick contact with a friend who had flu.  She went to urgent care today and they found her to be tachycardic.  She has a history of anemia.  She also gets frequent UTIs.  No nausea or vomiting.  No urinary symptoms.  The history is provided by the patient.  Influenza Presenting symptoms: cough, fatigue, fever, rhinorrhea and sore throat   Presenting symptoms: no diarrhea, no nausea, no shortness of breath and no vomiting   Fatigue:    Severity:  Moderate   Timing:  Constant   Progression:  Improving Relieved by:  Nothing Worsened by:  Nothing Ineffective treatments:  Hot fluids Associated symptoms: nasal congestion   Associated symptoms: no neck stiffness and no syncope   Risk factors: sick contacts        Past Medical History:  Diagnosis Date  . Anemia     There are no problems to display for this patient.   Past Surgical History:  Procedure Laterality Date  . HERNIA REPAIR       OB History   No obstetric history on file.     No family history on file.  Social History   Tobacco Use  . Smoking status: Never Smoker  . Smokeless tobacco: Never Used  Substance Use Topics  . Alcohol use: Not Currently  . Drug use: Not Currently    Home Medications Prior to Admission medications   Medication Sig Start Date End Date Taking? Authorizing Provider  Acetaminophen (TYLENOL 8 HOUR PO) Take 1 tablet by mouth 2 (two) times daily as needed.    [provider]  cefdinir (OMNICEF) 300 MG capsule Take 1 capsule (300 mg total) by mouth 2 (two) times daily. Patient not taking: Reported on 02/21/2020 07/09/19    Ward, Layla Maw, DO  cephALEXin (KEFLEX) 500 MG capsule Take 1 capsule (500 mg total) by mouth 2 (two) times daily. 03/01/20   Ward, Layla Maw, DO  ondansetron (ZOFRAN ODT) 4 MG disintegrating tablet Take 1 tablet (4 mg total) by mouth every 6 (six) hours as needed for nausea or vomiting. Patient not taking: Reported on 02/21/2020 07/09/19   Ward, Layla Maw, DO    Allergies    Patient has no known allergies.  Review of Systems   Review of Systems  Constitutional: Positive for fatigue and fever.  HENT: Positive for congestion, rhinorrhea and sore throat.   Eyes: Negative for visual disturbance.  Respiratory: Positive for cough. Negative for shortness of breath.   Cardiovascular: Negative for chest pain.  Gastrointestinal: Negative for abdominal pain, diarrhea, nausea and vomiting.  Genitourinary: Negative for dysuria.  Musculoskeletal: Negative for neck stiffness.  Skin: Negative for rash.  Neurological: Positive for light-headedness.    Physical Exam Updated Vital Signs BP 120/85 (BP Location: Left Arm)   Pulse 98   Temp 98.9 F (37.2 C) (Oral)   Resp 18   Ht 5\' 5"  (1.651 m)   Wt 68 kg   SpO2 100%   BMI 24.95 kg/m   Physical Exam Vitals and nursing note reviewed.  Constitutional:      General: She is not  in acute distress.    Appearance: Normal appearance. She is well-developed.  HENT:     Head: Normocephalic and atraumatic.  Eyes:     Conjunctiva/sclera: Conjunctivae normal.  Cardiovascular:     Rate and Rhythm: Regular rhythm. Tachycardia present.     Heart sounds: No murmur heard.   Pulmonary:     Effort: Pulmonary effort is normal. No respiratory distress.     Breath sounds: Normal breath sounds.  Abdominal:     Palpations: Abdomen is soft.     Tenderness: There is no abdominal tenderness.  Musculoskeletal:        General: No deformity or signs of injury. Normal range of motion.     Cervical back: Neck supple.     Right lower leg: No edema.     Left lower  leg: No edema.  Skin:    General: Skin is warm and dry.     Capillary Refill: Capillary refill takes less than 2 seconds.  Neurological:     General: No focal deficit present.     Mental Status: She is alert.     ED Results / Procedures / Treatments   Labs (all labs ordered are listed, but only abnormal results are displayed) Labs Reviewed  RESP PANEL BY RT-PCR (FLU A&B, COVID) ARPGX2 - Abnormal; Notable for the following components:      Result Value   Influenza A by PCR POSITIVE (*)    All other components within normal limits  BASIC METABOLIC PANEL - Abnormal; Notable for the following components:   CO2 21 (*)    All other components within normal limits  CBC WITH DIFFERENTIAL/PLATELET - Abnormal; Notable for the following components:   Hemoglobin 10.4 (*)    HCT 35.9 (*)    MCH 24.7 (*)    MCHC 29.0 (*)    RDW 20.4 (*)    All other components within normal limits  URINALYSIS, ROUTINE W REFLEX MICROSCOPIC - Abnormal; Notable for the following components:   Color, Urine AMBER (*)    APPearance CLOUDY (*)    Hgb urine dipstick SMALL (*)    Ketones, ur 20 (*)    Protein, ur 30 (*)    Leukocytes,Ua SMALL (*)    Bacteria, UA RARE (*)    All other components within normal limits  PREGNANCY, URINE    EKG EKG Interpretation  Date/Time:  Wednesday April 22 2020 20:39:24 EDT Ventricular Rate:  105 PR Interval:  143 QRS Duration: 81 QT Interval:  334 QTC Calculation: 442 R Axis:   65 Text Interpretation: Sinus tachycardia Borderline T abnormalities, inferior leads rate slowerthan prior today Confirmed by Meridee Score [phone]) on 04/22/2020 8:47:18 PM   Radiology No results found.  Procedures Procedures   Medications Ordered in ED Medications  sodium chloride 0.9 % bolus 1,000 mL (0 mLs Intravenous Stopped 04/22/20 2208)    ED Course  I have reviewed the triage vital signs and the nursing notes.  Pertinent labs & imaging results that were available during  my care of the patient were reviewed by me and considered in my medical decision making (see chart for details).  Clinical Course as of 04/23/20 0916  Wed Apr 22, 2020  2205 Clinically improved.  I reviewed her lab results with her.  She is comfortable plan for discharge after IV fluids and COVID swab result. [MB]    Clinical Course User Index [MB] Terrilee Files, MD   MDM Rules/Calculators/A&P  Tammie Cole was evaluated in Emergency Department on 04/22/2020 for the symptoms described in the history of present illness. She was evaluated in the context of the global COVID-19 pandemic, which necessitated consideration that the patient might be at risk for infection with the SARS-CoV-2 virus that causes COVID-19. Institutional protocols and algorithms that pertain to the evaluation of patients at risk for COVID-19 are in a state of rapid change based on information released by regulatory bodies including the CDC and federal and state organizations. These policies and algorithms were followed during the patient's care in the ED.  This patient complains of flulike symptoms with fevers and chills, nasal congestion, sore throat, lightheadedness; this involves an extensive number of treatment Options and is a complaint that carries with it a high risk of complications and Morbidity. The differential includes COVID, flu, dehydration, anemia, metabolic derangement  I ordered, reviewed and interpreted labs, which included CBC with normal white count, hemoglobin low but better than baseline, chemistries with mildly low bicarb reflecting some dehydration, urinalysis equivocal likely contaminated, COVID testing negative, flu a positive I ordered medication IV fluids Previous records obtained and reviewed prior urgent care visit today  After the interventions stated above, I reevaluated the patient and found patient's tachycardia to be resolved.  Reviewed work-up with her.  She is  comfortable plan for discharge.  Symptomatic treatment and return instructions discussed.   Final Clinical Impression(s) / ED Diagnoses Final diagnoses:  Influenza A    Rx / DC Orders ED Discharge Orders         Ordered    ondansetron (ZOFRAN) 4 MG tablet  Every 8 hours PRN        04/22/20 2207           Terrilee Files, MD 04/23/20 [phone]

## 2020-04-22 NOTE — ED Notes (Signed)
Called 911 to report HR of 160 and BP. Ambulance arriving on EMERGENCY transport.

## 2020-04-22 NOTE — ED Notes (Signed)
Called report to Charge RN at ED.

## 2020-04-22 NOTE — ED Provider Notes (Signed)
[facility]   MRN: [id number] DOB: [birth year]  Subjective:   Tammie Cole is a 20 y.o. female presenting for 4-day history of acute onset fever, malaise, throat pain, chills, coughing, watery eyes.  States that now she is feeling dizzy and is worse when she is coughing.  Of note, patient does have a history of anemia, last hemoglobin and hematocrit were 8.6 and 31.3% respectively. Has a history of severe anemia, has previously had to have iron transfusion, blood transfusion in the past 2-3 months.   No current facility-administered medications for this encounter.  Current Outpatient Medications:  .  Acetaminophen (TYLENOL 8 HOUR PO), Take 1 tablet by mouth 2 (two) times daily as needed., Disp: , Rfl:  .  cefdinir (OMNICEF) 300 MG capsule, Take 1 capsule (300 mg total) by mouth 2 (two) times daily. (Patient not taking: Reported on 02/21/2020), Disp: 20 capsule, Rfl: 0 .  cephALEXin (KEFLEX) 500 MG capsule, Take 1 capsule (500 mg total) by mouth 2 (two) times daily., Disp: 14 capsule, Rfl: 0 .  ondansetron (ZOFRAN ODT) 4 MG disintegrating tablet, Take 1 tablet (4 mg total) by mouth every 6 (six) hours as needed for nausea or vomiting. (Patient not taking: Reported on 02/21/2020), Disp: 20 tablet, Rfl: 0   No Known Allergies  Past Medical History:  Diagnosis Date  . Anemia      Past Surgical History:  Procedure Laterality Date  . HERNIA REPAIR      History reviewed. No pertinent family history.  Social History   Tobacco Use  . Smoking status: Never Smoker  . Smokeless tobacco: Never Used  Substance Use Topics  . Alcohol use: Not Currently  . Drug use: Not Currently    ROS   Objective:   Vitals: BP 98/63 (BP Location: Left Arm)   Pulse 80   Temp 98.7 F (37.1 C) (Oral)   Resp 20   LMP 03/20/2020 (Exact Date)   SpO2 100%   Tachycardia ranging between 143-160bpm.  Physical Exam Constitutional:      General: She is not in acute distress.     Appearance: Normal appearance. She is well-developed. She is not ill-appearing, toxic-appearing or diaphoretic.  HENT:     Head: Normocephalic and atraumatic.     Nose: Nose normal.     Mouth/Throat:     Mouth: Mucous membranes are moist.  Eyes:     Extraocular Movements: Extraocular movements intact.     Pupils: Pupils are equal, round, and reactive to light.  Cardiovascular:     Rate and Rhythm: Regular rhythm. Tachycardia present.     Pulses: Normal pulses.     Heart sounds: Normal heart sounds. No murmur heard. No friction rub. No gallop.   Pulmonary:     Effort: Pulmonary effort is normal. No respiratory distress.     Breath sounds: Normal breath sounds. No stridor. No wheezing, rhonchi or rales.  Skin:    General: Skin is warm and dry.     Findings: No rash.  Neurological:     Mental Status: She is alert and oriented to person, place, and time.  Psychiatric:        Mood and Affect: Mood normal.        Behavior: Behavior normal.        Thought Content: Thought content normal.        Judgment: Judgment normal.     ED ECG REPORT   Date: 04/22/2020  Rate: 133bpm  Rhythm: sinus  tachycardia  QRS Axis: normal  Intervals: normal  ST/T Wave abnormalities: nonspecific T wave changes  Conduction Disutrbances:none  Narrative Interpretation: Sinus tachycardia at 133bpm, non-specific t-wave flattening in I-III, aVL, aVF different from previous ecg.   Old EKG Reviewed: changes noted  I have personally reviewed the EKG tracing and agree with the computerized printout as noted.   Assessment and Plan :   PDMP not reviewed this encounter.  1. Tachycardia   2. Cough   3. Viral upper respiratory tract infection   4. Severe anemia   5. Nonspecific abnormal electrocardiogram (ECG) (EKG)     Patient is in need of a higher level of care than we can provide.  Suspect demand ischemia secondary to her undifferentiated severe anemia.  COVID-19 testing is pending.  An IV line was  established, patient placed on cardiac monitor.  EMS arrived, case report given out and transfer of care completed for transport to the hospital by EMS.   Wallis Bamberg, PA-C 04/22/20 2020

## 2020-04-22 NOTE — ED Triage Notes (Signed)
BIBA from urgent care. Urgent care took EKG and pt's HR was in @ 133. Pt HE 120 @ this time after IV bolus given by EMS. Pt was seen for covid like symptoms. Pt was around friend who  Had the flu.

## 2020-05-17 ENCOUNTER — Encounter (HOSPITAL_COMMUNITY): Payer: Self-pay

## 2020-05-17 ENCOUNTER — Other Ambulatory Visit: Payer: Self-pay

## 2020-05-17 ENCOUNTER — Ambulatory Visit (HOSPITAL_COMMUNITY)
Admission: EM | Admit: 2020-05-17 | Discharge: 2020-05-17 | Disposition: A | Discharge status: Home / Self Care | Payer: BC Managed Care – PPO | Attending: Emergency Medicine | Admitting: Emergency Medicine

## 2020-05-17 DIAGNOSIS — Z113 Encounter for screening for infections with a predominantly sexual mode of transmission: Secondary | ICD-10-CM

## 2020-05-17 DIAGNOSIS — J039 Acute tonsillitis, unspecified: Secondary | ICD-10-CM | POA: Diagnosis not present

## 2020-05-17 LAB — POCT RAPID STREP A, ED / UC: Streptococcus, Group A Screen (Direct): NEGATIVE

## 2020-05-17 MED ORDER — AMOXICILLIN 500 MG PO CAPS: 500.0000 mg | ORAL_CAPSULE | Freq: Two times a day (BID) | ORAL | 0 refills | Status: AC

## 2020-05-17 NOTE — ED Provider Notes (Signed)
[facility]    CSN: [id number] Arrival date & time: 05/17/20  1612      History   Chief Complaint Chief Complaint  Patient presents with  . Sore Throat    HPI Tammie Cole is a 20 y.o. female.   Patient here for evaluation of sore throat and chills that of been ongoing for the past 5 days.  Reports taking ibuprofen with minimal relief.  Denies any recent sick contacts.  Also requesting STD screening.  Denies any vaginal pain, discharge, dysuria, urgency, or frequency.  Denies any trauma, injury, or other precipitating event.  Denies any specific alleviating or aggravating factors.  Denies any fevers, chest pain, shortness of breath, N/V/D, numbness, tingling, weakness, abdominal pain, or headaches.   ROS: As per HPI, all other pertinent ROS negative   The history is provided by the patient.  Sore Throat    Past Medical History:  Diagnosis Date  . Anemia     There are no problems to display for this patient.   Past Surgical History:  Procedure Laterality Date  . HERNIA REPAIR      OB History   No obstetric history on file.      Home Medications    Prior to Admission medications   Medication Sig Start Date End Date Taking? Authorizing Provider  amoxicillin (AMOXIL) 500 MG capsule Take 1 capsule (500 mg total) by mouth 2 (two) times daily for 10 days. 05/17/20 05/27/20 Yes Ivette Loyal, NP  Acetaminophen (TYLENOL 8 HOUR PO) Take 1 tablet by mouth 2 (two) times daily as needed.    [provider]  ondansetron (ZOFRAN ODT) 4 MG disintegrating tablet Take 1 tablet (4 mg total) by mouth every 6 (six) hours as needed for nausea or vomiting. Patient not taking: Reported on 02/21/2020 07/09/19   Ward, Layla Maw, DO  ondansetron (ZOFRAN) 4 MG tablet Take 1 tablet (4 mg total) by mouth every 8 (eight) hours as needed for nausea or vomiting. 04/22/20   Terrilee Files, MD    Family History History reviewed. No pertinent family history.  Social  History Social History   Tobacco Use  . Smoking status: Never Smoker  . Smokeless tobacco: Never Used  Substance Use Topics  . Alcohol use: Not Currently  . Drug use: Not Currently     Allergies   Patient has no known allergies.   Review of Systems Review of Systems  HENT: Positive for congestion, sore throat and voice change.   All other systems reviewed and are negative.    Physical Exam Triage Vital Signs ED Triage Vitals  Enc Vitals Group     BP 05/17/20 1726 110/85     Pulse Rate 05/17/20 1726 (!) 109     Resp 05/17/20 1726 17     Temp 05/17/20 1726 99.1 F (37.3 C)     Temp Source 05/17/20 1726 Oral     SpO2 05/17/20 1726 98 %     Weight --      Height --      Head Circumference --      Peak Flow --      Pain Score 05/17/20 1727 7     Pain Loc --      Pain Edu? --      Excl. in GC? --    No data found.  Updated Vital Signs BP 110/85 (BP Location: Left Arm)   Pulse (!) 109   Temp 99.1 F (37.3 C) (Oral)  Resp 17   LMP 04/20/2020   SpO2 98%   Visual Acuity Right Eye Distance:   Left Eye Distance:   Bilateral Distance:    Right Eye Near:   Left Eye Near:    Bilateral Near:     Physical Exam Vitals and nursing note reviewed.  Constitutional:      General: She is not in acute distress.    Appearance: Normal appearance. She is not ill-appearing, toxic-appearing or diaphoretic.  HENT:     Head: Normocephalic and atraumatic.     Nose: Congestion present.     Mouth/Throat:     Pharynx: Uvula midline. Pharyngeal swelling and posterior oropharyngeal erythema present. No oropharyngeal exudate or uvula swelling.     Tonsils: No tonsillar exudate. 3+ on the right. 3+ on the left.  Eyes:     Conjunctiva/sclera: Conjunctivae normal.  Cardiovascular:     Rate and Rhythm: Normal rate.     Pulses: Normal pulses.  Pulmonary:     Effort: Pulmonary effort is normal.  Abdominal:     General: Abdomen is flat.  Musculoskeletal:        General: Normal  range of motion.     Cervical back: Normal range of motion.  Skin:    General: Skin is warm and dry.  Neurological:     General: No focal deficit present.     Mental Status: She is alert and oriented to person, place, and time.  Psychiatric:        Mood and Affect: Mood normal.      UC Treatments / Results  Labs (all labs ordered are listed, but only abnormal results are displayed) Labs Reviewed  CULTURE, GROUP A STREP [facility])  POCT RAPID STREP A, ED / UC  CYTOLOGY, (ORAL, ANAL, URETHRAL) ANCILLARY ONLY  CERVICOVAGINAL ANCILLARY ONLY    EKG   Radiology No results found.  Procedures Procedures (including critical care time)  Medications Ordered in UC Medications - No data to display  Initial Impression / Assessment and Plan / UC Course  I have reviewed the triage vital signs and the nursing notes.  Pertinent labs & imaging results that were available during my care of the patient were reviewed by me and considered in my medical decision making (see chart for details).     Assessment negative for red flags or concerns including peritonsillar abscess.  Rapid strep negative, throat culture pending.  Throat also swabbed for STIs and results pending.  Will treat for tonsillitis with amoxicillin twice daily for the next 10 days.  Discussed conservative symptom management as described in discharge instructions.  Self swab also obtained and will treat based on results.  Discussed safe sex practices including condom or other barrier method use.  Follow-up as needed.   Final Clinical Impressions(s) / UC Diagnoses   Final diagnoses:  Acute tonsillitis, unspecified etiology  Screen for STD (sexually transmitted disease)     Discharge Instructions     The amoxicillin twice a day for the next 10 days.  You can take Tylenol and/or Ibuprofen as needed for fever reduction and pain relief.   For cough: honey 1/2 to 1 teaspoon (you can dilute the honey in water or another fluid).   You can use a humidifier for chest congestion and cough.  If you don't have a humidifier, you can sit in the bathroom with the hot shower running.    For sore throat: try warm salt water gargles, cepacol lozenges, throat spray, warm tea or water  with lemon/honey, popsicles or ice, or OTC cold relief medicine for throat discomfort.    For congestion: take a daily anti-histamine like Zyrtec, Claritin, and a oral decongestant to help with post nasal drip that may be irritating your throat.    It is important to stay hydrated: drink plenty of fluids (water, gatorade/powerade/pedialyte, juices, or teas) to keep your throat moisturized and help further relieve irritation/discomfort.  We will contact you if any of your lab work comes back positive and if any change to your antibiotics need to be made.  Do not have sex while taking undergoing treatment for STI.  Make sure that all of your partners get tested and treated.   Use a condom or other barrier method for all sexual encounters.    Return or go to the Emergency Department if symptoms worsen or do not improve in the next few days.      ED Prescriptions    Medication Sig Dispense Auth. Provider   amoxicillin (AMOXIL) 500 MG capsule Take 1 capsule (500 mg total) by mouth 2 (two) times daily for 10 days. 20 capsule Ivette Loyal, NP     PDMP not reviewed this encounter.   Ivette Loyal, NP 05/17/20 1801

## 2020-05-17 NOTE — Discharge Instructions (Signed)
The amoxicillin twice a day for the next 10 days.  You can take Tylenol and/or Ibuprofen as needed for fever reduction and pain relief.   For cough: honey 1/2 to 1 teaspoon (you can dilute the honey in water or another fluid).  You can use a humidifier for chest congestion and cough.  If you don't have a humidifier, you can sit in the bathroom with the hot shower running.    For sore throat: try warm salt water gargles, cepacol lozenges, throat spray, warm tea or water with lemon/honey, popsicles or ice, or OTC cold relief medicine for throat discomfort.    For congestion: take a daily anti-histamine like Zyrtec, Claritin, and a oral decongestant to help with post nasal drip that may be irritating your throat.    It is important to stay hydrated: drink plenty of fluids (water, gatorade/powerade/pedialyte, juices, or teas) to keep your throat moisturized and help further relieve irritation/discomfort.  We will contact you if any of your lab work comes back positive and if any change to your antibiotics need to be made.  Do not have sex while taking undergoing treatment for STI.  Make sure that all of your partners get tested and treated.   Use a condom or other barrier method for all sexual encounters.    Return or go to the Emergency Department if symptoms worsen or do not improve in the next few days.

## 2020-05-17 NOTE — ED Triage Notes (Signed)
Pt presents with sore throat and chills X 5 days.

## 2020-05-18 LAB — CERVICOVAGINAL ANCILLARY ONLY
Bacterial Vaginitis (gardnerella): NEGATIVE
Candida Glabrata: NEGATIVE
Candida Vaginitis: POSITIVE — AB
Chlamydia: NEGATIVE
Comment: NEGATIVE
Comment: NEGATIVE
Comment: NEGATIVE
Comment: NEGATIVE
Comment: NEGATIVE
Comment: NORMAL
Neisseria Gonorrhea: NEGATIVE
Trichomonas: NEGATIVE

## 2020-05-18 LAB — CYTOLOGY, (ORAL, ANAL, URETHRAL) ANCILLARY ONLY
Chlamydia: NEGATIVE
Comment: NEGATIVE
Comment: NORMAL
Neisseria Gonorrhea: NEGATIVE

## 2020-05-21 ENCOUNTER — Telehealth (HOSPITAL_COMMUNITY): Payer: Self-pay | Admitting: Emergency Medicine

## 2020-05-21 MED ORDER — FLUCONAZOLE 150 MG PO TABS: 150.0000 mg | ORAL_TABLET | Freq: Every day | ORAL | 0 refills | Status: AC

## 2020-11-26 IMAGING — CT CT ABD-PELV W/ CM
2 of 4 series · 16 of 46 positions shown, 18 images · IV contrast (omnipaque)
Comparison: 01/16/2017

CLINICAL DATA: Bilateral flank pain and fevers, history of UTI and
elevated white blood cell count

EXAM:
CT ABDOMEN AND PELVIS WITH CONTRAST
TECHNIQUE: Multidetector CT imaging of the abdomen and pelvis was performed
using the standard protocol following bolus administration of
intravenous contrast.
CONTRAST:  100mL OMNIPAQUE IOHEXOL 300 MG/ML  SOLN

[Series 2: axial st · axial · 0.68mm/px · z∈[-500,-114]mm · 13 of 87 slices shown, 15 images]
[im 5/87  soft-tissue]
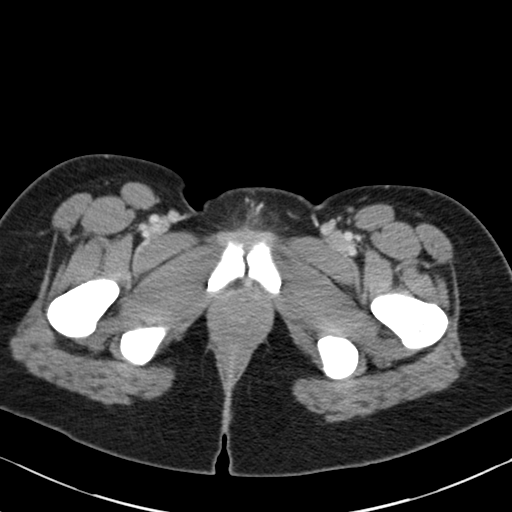
[im 5/87  bone]
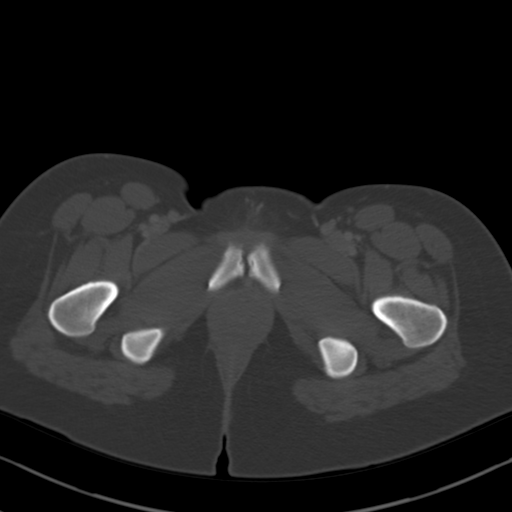
[im 13/87  soft-tissue]
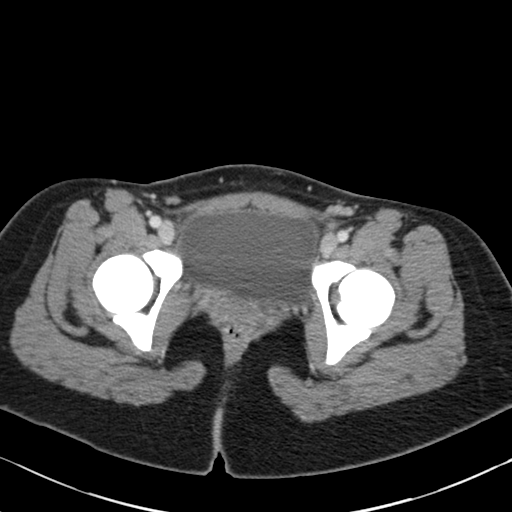
[im 17/87  soft-tissue]
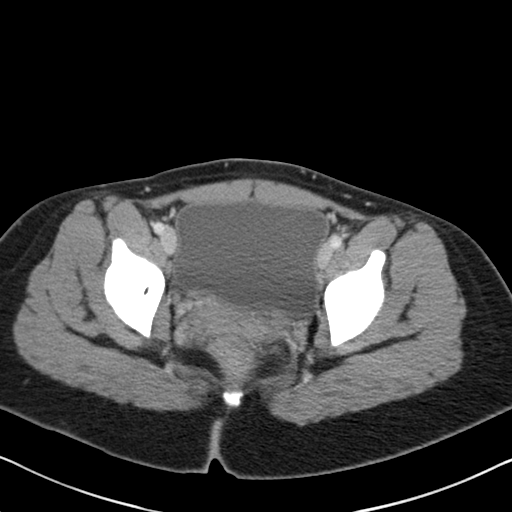
[im 25/87  soft-tissue]
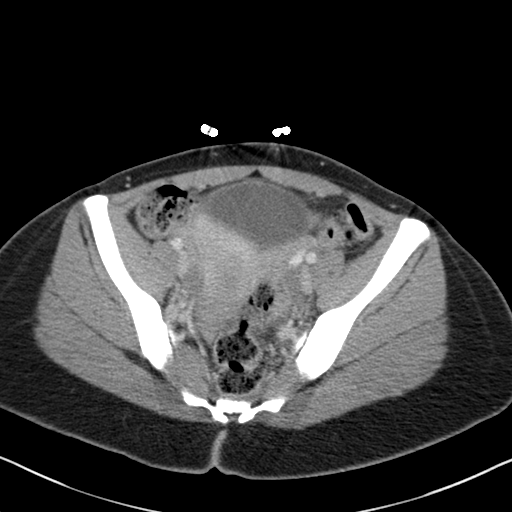
[im 29/87  soft-tissue]
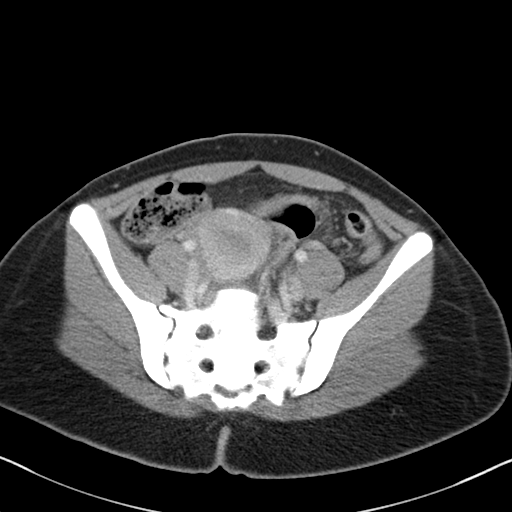
[im 37/87  soft-tissue]
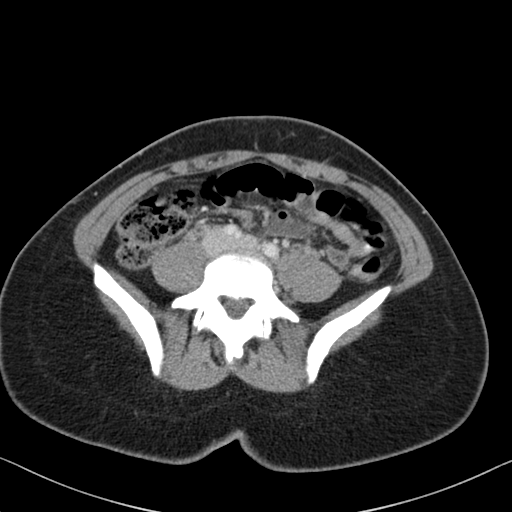
[im 46/87  soft-tissue]
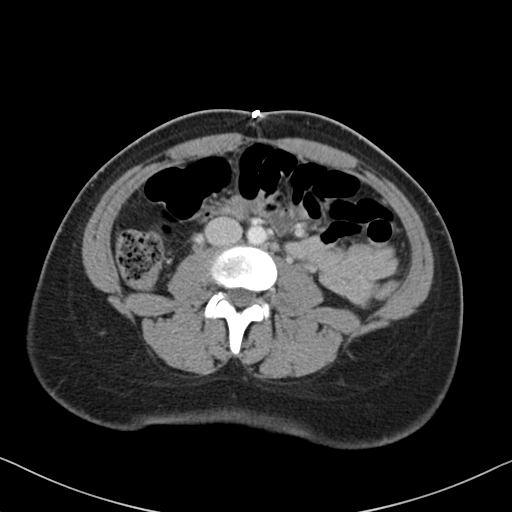
[im 50/87  soft-tissue]
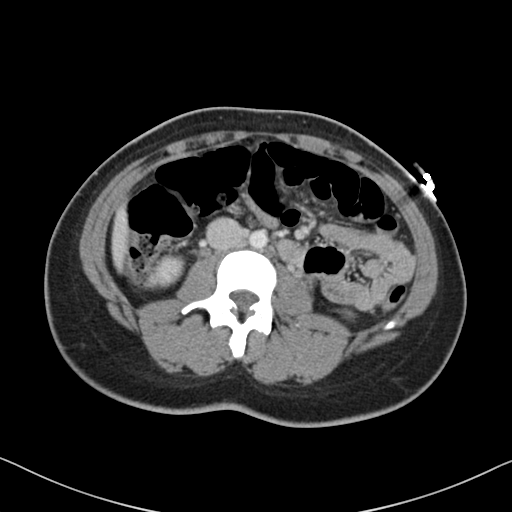
[im 58/87  soft-tissue]
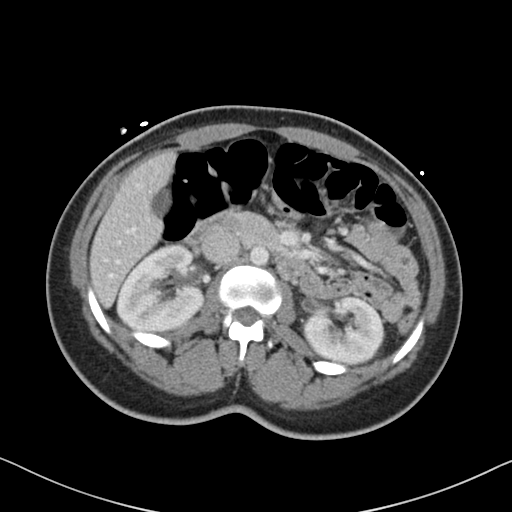
[im 58/87  bone]
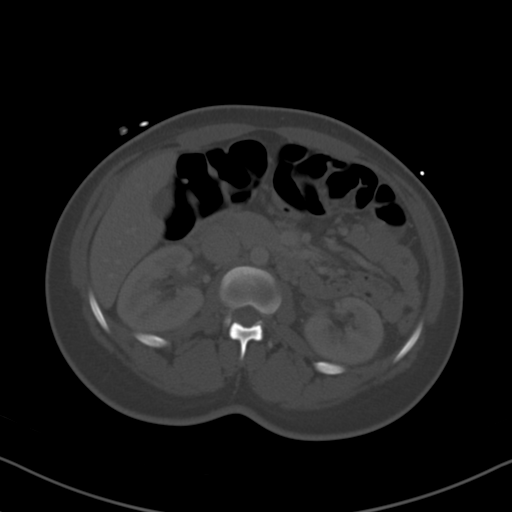
[im 62/87  soft-tissue]
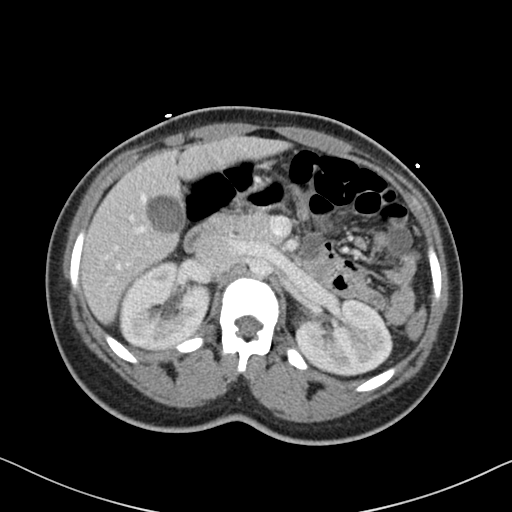
[im 70/87  soft-tissue]
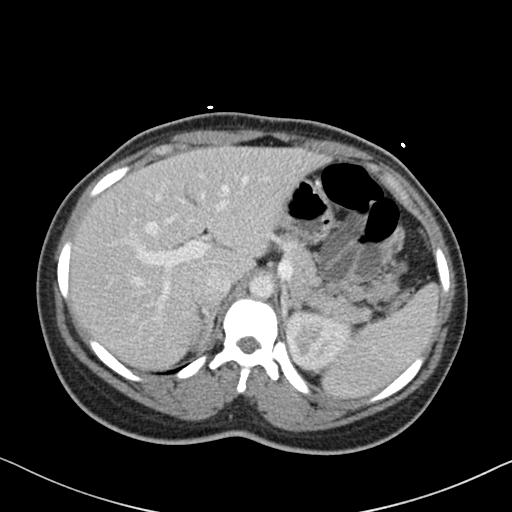
[im 74/87  soft-tissue]
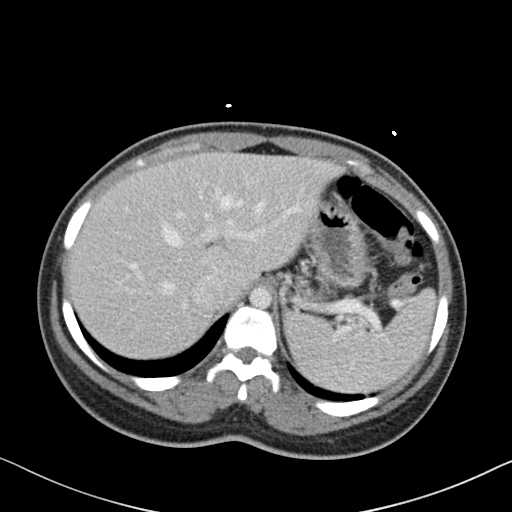
[im 82/87  soft-tissue]
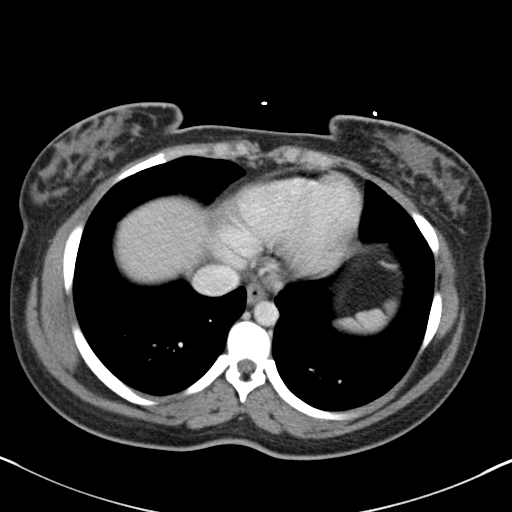

[Series 4: coronal st · coronal · 0.69mm/px · 3 of 118 slices shown]
[im 40/118  soft-tissue]
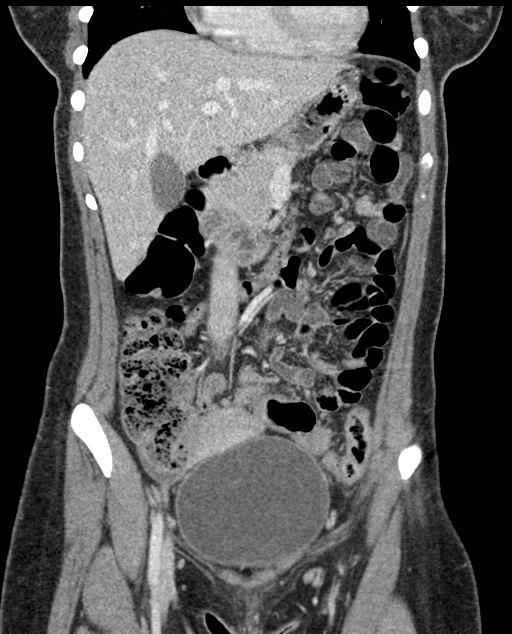
[im 53/118  soft-tissue]
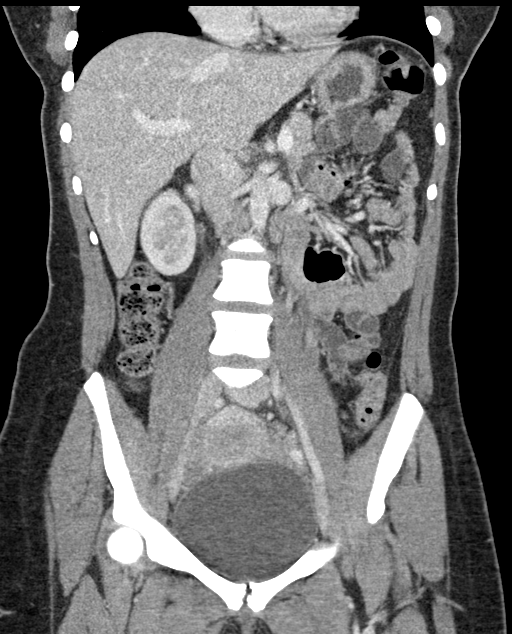
[im 66/118  soft-tissue]
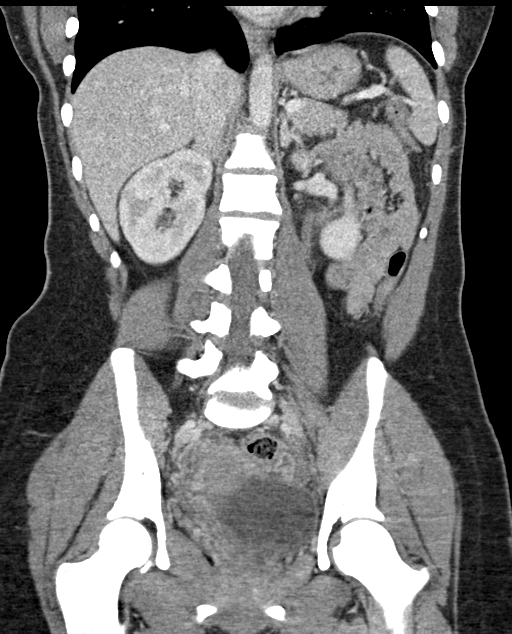

[16 of 46 positions shown; findings below may reference images not displayed]

FINDINGS: Lower chest: No acute abnormality.

Hepatobiliary: No focal liver abnormality is seen. No gallstones,
gallbladder wall thickening, or biliary dilatation.

Pancreas: Unremarkable. No pancreatic ductal dilatation or
surrounding inflammatory changes.

Spleen: Normal in size without focal abnormality.

Adrenals/Urinary Tract: Adrenal glands are within normal limits
bilaterally. Kidneys demonstrate a normal enhancement pattern
bilaterally. No findings to suggest pyelonephritis are noted. Tiny
nonobstructing right renal stone is seen. Ureters show no
significant obstructive change although a ureteral wall enhancement
is noted suggestive of UTI. The bladder is well distended.

Stomach/Bowel: Colon shows no obstructive or inflammatory changes.
The appendix is within normal limits. Small bowel and stomach are
unremarkable.

Vascular/Lymphatic: No significant vascular findings are present. No
enlarged abdominal or pelvic lymph nodes.

Reproductive: Uterus and bilateral adnexa are unremarkable.

Other: No abdominal wall hernia or abnormality. No abdominopelvic
ascites.

Musculoskeletal: No acute or significant osseous findings.
IMPRESSION: Changes suggestive of UTI with ureteral wall enhancement. No other
focal abnormality is noted.
